# Patient Record
Sex: Female | Born: 1955 | Race: White | Hispanic: No | Smoking: Never smoker
Health system: Southern US, Community
[De-identification: ages and names within clinical notes are randomized; demographics above are authoritative.]

## PROBLEM LIST (undated history)

## (undated) DIAGNOSIS — E785 Hyperlipidemia, unspecified: Secondary | ICD-10-CM

## (undated) DIAGNOSIS — E119 Type 2 diabetes mellitus without complications: Secondary | ICD-10-CM

## (undated) HISTORY — DX: Hyperlipidemia, unspecified: E78.5

## (undated) HISTORY — PX: SPINE SURGERY: SHX786

## (undated) HISTORY — PX: COLONOSCOPY: SHX174

## (undated) HISTORY — PX: HAND SURGERY: SHX662

## (undated) HISTORY — DX: Type 2 diabetes mellitus without complications: E11.9

---

## 1998-12-28 ENCOUNTER — Other Ambulatory Visit: Admission: RE | Admit: 1998-12-28 | Discharge: 1998-12-28 | Payer: Self-pay | Admitting: Obstetrics and Gynecology

## 1999-06-03 ENCOUNTER — Encounter: Payer: Self-pay | Admitting: Obstetrics and Gynecology

## 1999-06-03 ENCOUNTER — Encounter: Admission: RE | Admit: 1999-06-03 | Discharge: 1999-06-03 | Payer: Self-pay | Admitting: Obstetrics and Gynecology

## 2000-02-01 ENCOUNTER — Other Ambulatory Visit: Admission: RE | Admit: 2000-02-01 | Discharge: 2000-02-01 | Payer: Self-pay | Admitting: Obstetrics and Gynecology

## 2000-02-02 ENCOUNTER — Encounter (INDEPENDENT_AMBULATORY_CARE_PROVIDER_SITE_OTHER): Payer: Self-pay

## 2000-02-02 ENCOUNTER — Other Ambulatory Visit: Admission: RE | Admit: 2000-02-02 | Discharge: 2000-02-02 | Payer: Self-pay | Admitting: Obstetrics and Gynecology

## 2001-02-18 ENCOUNTER — Other Ambulatory Visit: Admission: RE | Admit: 2001-02-18 | Discharge: 2001-02-18 | Payer: Self-pay | Admitting: Obstetrics and Gynecology

## 2002-05-06 ENCOUNTER — Other Ambulatory Visit: Admission: RE | Admit: 2002-05-06 | Discharge: 2002-05-06 | Payer: Self-pay | Admitting: Obstetrics and Gynecology

## 2002-07-31 ENCOUNTER — Ambulatory Visit (HOSPITAL_COMMUNITY): Admission: RE | Admit: 2002-07-31 | Discharge: 2002-07-31 | Payer: Self-pay | Admitting: Obstetrics and Gynecology

## 2002-07-31 ENCOUNTER — Encounter (INDEPENDENT_AMBULATORY_CARE_PROVIDER_SITE_OTHER): Payer: Self-pay | Admitting: *Deleted

## 2003-06-18 ENCOUNTER — Other Ambulatory Visit: Admission: RE | Admit: 2003-06-18 | Discharge: 2003-06-18 | Payer: Self-pay | Admitting: Obstetrics and Gynecology

## 2003-09-08 ENCOUNTER — Encounter (INDEPENDENT_AMBULATORY_CARE_PROVIDER_SITE_OTHER): Payer: Self-pay | Admitting: *Deleted

## 2003-09-09 ENCOUNTER — Inpatient Hospital Stay (HOSPITAL_COMMUNITY): Admission: RE | Admit: 2003-09-09 | Discharge: 2003-09-11 | Payer: Self-pay | Admitting: Obstetrics and Gynecology

## 2004-12-06 ENCOUNTER — Other Ambulatory Visit: Admission: RE | Admit: 2004-12-06 | Discharge: 2004-12-06 | Payer: Self-pay | Admitting: Obstetrics and Gynecology

## 2007-03-28 ENCOUNTER — Ambulatory Visit: Payer: Self-pay | Admitting: Internal Medicine

## 2007-04-11 ENCOUNTER — Ambulatory Visit: Payer: Self-pay | Admitting: Internal Medicine

## 2007-07-11 ENCOUNTER — Ambulatory Visit (HOSPITAL_COMMUNITY): Admission: RE | Admit: 2007-07-11 | Discharge: 2007-07-12 | Payer: Self-pay | Admitting: Orthopedic Surgery

## 2010-06-28 NOTE — Op Note (Signed)
Megan Stephenson, PENICK                  ACCOUNT NO.:  [id number]   MEDICAL RECORD NO.:  [id number]          PATIENT TYPE:  AMB   LOCATION:  DAY                          FACILITY:  [facility]   PHYSICIAN:  Georges Lynch. Gioffre, M.D.DATE OF BIRTH:  04/07/1955   DATE OF PROCEDURE:  07/11/2007  DATE OF DISCHARGE:                               OPERATIVE REPORT   SURGEON:  Dr. Darrelyn Hillock.   ASSISTANT:  Dr. Marlowe Kays.   PREOPERATIVE DIAGNOSES:  Large herniated lumbar disk with migration  proximally with a partial footdrop on the left.   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   OPERATION:  1. Decompression of lateral recess at L5-S1 on the left.  2. Microdiskectomy L5-S1 on the left.   DESCRIPTION OF PROCEDURE:  Under general anesthesia with the patient on  a spinal frame, routine orthopedic prep and draping of the lower back  was carried out.  The patient was given 1 gm of IV Ancef.  Two needles  were placed in the back for localization purposes.  I then made an  incision over the L5-S1 interspace.  Bleeders identified and cauterized  and the self-retaining retractors were inserted after we stripped the  muscle from the spinous process and the lamina.  Another x-ray was taken  with instruments in place, and we localized the L5-S1 space.  We then  carried out a hemilaminectomy.  We had to go far proximally and way out  lateral in the lateral recess to decompress the recess before we got to  the large disk fragment.  This fragment migrated proximally and was  exerting pressure on the 5 root above.  The disk was herniated  subligamentous.  The microscope was brought.  We removed the ligamentum  flavum in the usual fashion.  With the microscope, we went down and  gently retracted the dura with the D'Errico retractor.  We cauterized  lateral recess veins after we decompressed the recess.  We then went in  and made a  cruciate incision in the posterior longitudinal ligament.  We  then inserted a nerve hook and literally brought out a huge large  fragment of disk that was migrated proximally.  Once that was done, we  passed the Epstein curette up under the posterior longitudinal ligament  and there was no other fragments proximally.  We then went distally and  removed another large piece of disk distally.  Once that was done, the  nerve root now was free.  We did a nice foraminotomy distally as well.  We were able to easily pass the hockey-stick distally and proximally and  the dura now was free.  The foramen was free from the 5 root and the S1  root.  We thoroughly irrigated out the area.  There was no other  abnormalities noted.  I inserted 10 mL of FloSeal along with some  thrombin-soaked Gelfoam loosely.  I then closed the wound in layers in the usual fashion.  I left a small  deep portion of the wound  open proximally and distally for drainage  purposes.  The subcu was closed with 0-Vicryl, skin with metal staples  and a sterile Neosporin dressing was applied.  The patient left the  operating room in satisfactory condition.           ______________________________  Georges Lynch Darrelyn Hillock, M.D.     RAG/MEDQ  D:  07/11/2007  T:  07/11/2007  Job:  [id number]

## 2010-07-01 NOTE — H&P (Signed)
NAMELin, Megan Stephenson NO.:  [id number]   MEDICAL RECORD NO.:  [id number]                   PATIENT TYPE:   LOCATION:                                       FACILITY:   PHYSICIAN:  Duke Salvia. Marcelle Overlie, M.D.            DATE OF BIRTH:   DATE OF ADMISSION:  09/08/2003  DATE OF DISCHARGE:                                HISTORY & PHYSICAL   CHIEF COMPLAINT:  Menorrhagia.   HISTORY OF PRESENT ILLNESS:  A 54 year old, G4, P2, prior tubal.  This  patient has had 2 prior cesarean sections.  Due to problems with continued  bleeding, she underwent D&C and cryoablation June 2004.  Pathology then was  unremarkable.  She, unfortunately, has continued to have problems with heavy  bleeding, and presents now for LSH/BSO.  She has never had abnormal  cytology.  After discussing options for hysterectomy, has a preference for  [facility].  This procedure, including risks of bleeding, infection, transfusion,  adjacent organ injury, the possible need for open or additional surgery were  all discussed, along with the need for continued follow up cytology.   PAST MEDICAL HISTORY:   ALLERGIES:  PENICILLIN.   OPERATIONS:  1. A D&C.  2. Cryoablation.  3. Cesarean section x2.   FAMILY HISTORY:  Unremarkable.   PHYSICAL EXAMINATION:  VITAL SIGNS:  Temperature 98.2, blood pressure  114/70.  HEENT:  Unremarkable.  NECK:  Supple without masses.  LUNGS:  Clear.  CARDIOVASCULAR:  Regular rate and rhythm without murmurs, rubs, or gallops.  BREASTS:  Without masses.  ABDOMEN:  Soft, flat, nontender.  PELVIC:  Normal external genital.  __________ clear.  Uterus mid-position,  normal size.  Adnexa negative.  EXTREMITIES:  Unremarkable.  NEUROLOGIC:  Unremarkable.   IMPRESSION:  Continued menorrhagia with possible adenomyosis.   PLAN:  LSH/BSO.   Procedure and risks reviewed as above.                                               Richard M. Marcelle Overlie, M.D.    RMH/MEDQ  D:   09/01/2003  T:  09/01/2003  Job:  [id number]

## 2010-07-01 NOTE — Discharge Summary (Signed)
NAME:  Megan Stephenson, Megan Stephenson                            ACCOUNT NO.:  [id number]   MEDICAL RECORD NO.:  [id number]                   PATIENT TYPE:  INP   LOCATION:  9308                                 FACILITY:  [facility]   PHYSICIAN:  Duke Salvia. Marcelle Overlie, M.D.            DATE OF BIRTH:  11/28/1955   DATE OF ADMISSION:  09/08/2003  DATE OF DISCHARGE:                                 DISCHARGE SUMMARY   DISCHARGE DIAGNOSES:  1. Pelvic pain and menorrhagia.  2. Laparoscopy followed by TAH/BSO this admission.   SUMMARY OF THE HISTORY AND PHYSICAL EXAMINATION:  Please see admission H&P  for details.  Briefly, a 55 year old with menorrhagia and chronic pelvic  pain presents for hysterectomy.   HOSPITAL COURSE:  On September 08, 2003 under general anesthesia the patient  underwent laparoscopy.  Due to dense anterior uterine and bladder flap  adhesions, decision made to proceed with open TAH/BSO which was carried out  without difficulty.  On postoperative day #1 hemoglobin was 11.8, her diet  was slowly advanced.  Postoperative day #2 she was still complaining of some  significant soreness although she was afebrile and tolerating a regular diet  at that point.  We waited for discharge to postoperative day #3.  The  morning of discharge she was afebrile, ambulating without difficulty, still  complaining of soreness but this was managed with her oral pain medicine.  She was afebrile.  The incision was clean and dry except for some bruising  and she was ready for discharge at that point.   LABORATORY DATA:  Preoperative hemoglobin 15.4.  Blood type is O positive.  UA was negative.  Postoperative hemoglobin on July 27 was 11.8.  Pathology  report is still pending.   DISPOSITION:  The patient discharged on Tylox p.r.n. pain.  Will return to  the office in 3 days to have the clips removed.  Advised to report any  incisional redness or drainage, increased pain or bleeding, or fever over  101.  She was given  specific instructions regarding diet, sex, exercise.   CONDITION:  Good.   ACTIVITY:  Graded increase.                                               Richard M. Marcelle Overlie, M.D.    RMH/MEDQ  D:  09/11/2003  T:  09/11/2003  Job:  [id number]

## 2010-07-01 NOTE — Op Note (Signed)
   NAME:  Megan Stephenson, Megan Stephenson                            ACCOUNT NO.:  [id number]   MEDICAL RECORD NO.:  [id number]                   PATIENT TYPE:  AMB   LOCATION:  SDC                                  FACILITY:  [facility]   PHYSICIAN:  Duke Salvia. Marcelle Overlie, M.D.            DATE OF BIRTH:  1955-12-16   DATE OF PROCEDURE:  07/31/2002  DATE OF DISCHARGE:                                 OPERATIVE REPORT   PREOPERATIVE DIAGNOSIS:  Menorrhagia.   POSTOPERATIVE DIAGNOSIS:  Menorrhagia.   PROCEDURE:  Dilatation and curettage, cryoablation.   SURGEON:  Duke Salvia. Marcelle Overlie, M.D.   ANESTHESIA:  General endotracheal.   COMPLICATIONS:  None.   DRAINS:  Foley catheter to fill the bladder.   PROCEDURE AND FINDINGS:  The patient taken to the operating room.  After an  adequate level of general endotracheal anesthesia was obtained with the  patient's legs in stirrups, the perineum and vagina prepped and draped in  the usual manner for a vaginal procedure.  The uterus was examined and noted  to be midposition, normal size, adnexa negative.  The catheter was  positioned, the bladder was filled in preparation for ultrasound.  The  cervix was grasped with a tenaculum and was sounded to 9-10 cm.  It was  progressively dilated to a 30 Pratt dilator.  A small curette was then used  to perform a D&C.  There was minimal tissue noted.  This was submitted to  pathology.  The uterus was re-sounded, confirmed depth of 9-10 cm.  Under  ultrasound guidance the uterus was imaged in a longitudinal plane and the  cryoprobe was inserted to the cornu at the appropriate depth and was noted  on ultrasound.  A five-minute cryo sequence was then performed, observing  ice ball formation on ultrasound.  This was thawed and removed.  We waited  five minutes and then passed the cryoprobe into the opposite cornu at the  appropriate depth, again verified by ultrasound, five-minute second cryo  sequence performed without difficulty.  She  tolerated this well and went to  the recovery room in good condition.                                               Richard M. Marcelle Overlie, M.D.    RMH/MEDQ  D:  07/31/2002  T:  07/31/2002  Job:  [id number]

## 2010-07-01 NOTE — H&P (Signed)
   NAMERichelle, Glick NO.:  [id number]   MEDICAL RECORD NO.:  [id number]                   PATIENT TYPE:   LOCATION:                                       FACILITY:   PHYSICIAN:  Duke Salvia. Marcelle Overlie, M.D.            DATE OF BIRTH:   DATE OF ADMISSION:  07/31/2002  DATE OF DISCHARGE:                                HISTORY & PHYSICAL   CHIEF COMPLAINT:  Menorrhagia.   HISTORY AND PHYSICAL:  The patient is a 55 year old Gravida IV, Para 2 with  a prior tubal who has been on Ortho-Evra for management of DUB without  significant improvement.  In our office she had FHD that showed no  intracavity masses or other abnormalities except for a possible thickened  myometrium suggestive of adenomyosis.  She presents now for cryoablation.  This procedure including the risks of bleeding, infection, other  complications such as perforation that may require additional surgery were  all discussed with her.  The 30% chance for amenorrhea and the 80 to 90%  hypomenorrhea rate were discussed with her, all of which she understands and  accepts.   ALLERGIES:  1. PENICILLIN.   CURRENT MEDICATIONS:  1. Pravachol.   REVIEW OF SYMPTOMS:  OB/GYN:  Significant for DUB.  She has had two prior  cesarean sections with a tubal ligation.  GENERAL:  She has a history of  being on Redux in the mid nineties for weight loss.   PHYSICAL EXAMINATION:  VITAL SIGNS:  Temperature 98.2, blood pressure  120/72.  HEENT:  Unremarkable. The neck is supple without masses.  LUNGS:  Clear.  CARDIOVASCULAR:  Regular rate and rhythm without murmurs, rubs or gallops.  BREASTS:  Without masses.  ABDOMEN:  Flat, soft and nontender.  PELVIC EXAM:  Normal external genitalia.  Vagina:  Introitus is clear.  Uterus anterior.  Adnexa are negative.   IMPRESSION:  1. Normal exam.  2. Menorrhagia unresponsive to conservative measures.   PLAN:  Cryoablation.  The procedure and the risks are reviewed  as above.                                               Richard M. Marcelle Overlie, M.D.    RMH/MEDQ  D:  07/29/2002  T:  07/29/2002  Job:  [id number]

## 2010-07-01 NOTE — Op Note (Signed)
NAME:  Megan Stephenson, Megan Stephenson                            ACCOUNT NO.:  [id number]   MEDICAL RECORD NO.:  [id number]                   PATIENT TYPE:  OBV   LOCATION:  9399                                 FACILITY:  [facility]   PHYSICIAN:  Duke Salvia. Marcelle Overlie, M.D.            DATE OF BIRTH:  1955-03-27   DATE OF PROCEDURE:  09/08/2003  DATE OF DISCHARGE:                                 OPERATIVE REPORT   PREOPERATIVE. DIAGNOSES:  Chronic pelvic pain and menorrhagia.   POSTOPERATIVE DIAGNOSES:  1. Chronic pelvic pain and menorrhagia.  2. 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   PROCEDURE:  Diagnostic laparoscopy followed by TAH/BSO, lysis of adhesions.   SURGEON:  Duke Salvia. Marcelle Overlie, M.D.   ASSISTANT:  Guy Sandifer. Henderson Cloud, M.D.   ANESTHESIA:  General endotracheal.   SPECIMENS REMOVED:  Uterus, bilateral tubes and ovaries.   ESTIMATED BLOOD LOSS:  200.   DESCRIPTION OF PROCEDURE:  The patient was taken to the operating room and  after an adequate level of general endotracheal anesthesia was administered  with the patient's legs in stirrups, the abdomen, perineum and vagina were  prepped and draped with Betadine and bladder was drained. EUA carried out,  uterus mid position, normal size, adnexa negative.  A sound was positioned  into the uterus.  The subumbilical area was infiltrated with 0.5% Marcaine  plain, a small incision was made, the Veress needle was introduced without  difficulty, its intraabdominal position was verified by pressure and water  testing.  After a 2.5 liter pneumoperitoneum was then created, laparoscopic  trocar and sleeve were then introduced without difficulty. With the patient  in Trendelenburg, dense severe adhesions between the bladder flap area,  uterus and anterior abdominal wall were noted. A decision made to proceed  with laparotomy, TAH and BSO.  The patient's legs were placed high, Foley  catheter was already in draining clear urine.  A  Pfannenstiel incision made  through the old scar which was carried down the fascia, the fascia incised  and extended transversely. The rectus muscle was divided in the midline,  peritoneum entered superiorly without incident and extended in a vertical  manner.  An O'Connor-O'Sullivan retractor was positioned, the bowel was  packed superiorly out of the field. Bilateral tubes and ovaries were normal.  The dense adhesions were lysed with sharp and blunt dissection staying close  to the uterus.  The bladder was advanced below with sharp and blunt  dissection below the cervicovaginal junction. The uteroovarian pedicles were  then clamped with Kelly clamps. Starting on the left, the round ligaments  were clamped, divided and suture ligated with #0 Vicryl suture. The  retroperitoneal space on that side was developed, the course of the ureter  was well below. The left IP ligament was isolated, clamped, first free tied,  followed by suture ligature of #0 Vicryl.  This was done after assuring that  the course of the ureter was well below, the exact same repeated on the  opposite side.  After this was completed, the uterine vasculature pedicles  on each side were skeletonized, clamped, divided and suture ligated with #0  Dexon.  After assuring that the bladder was well below the cardinal  ligament, the uterosacral ligament and cervicovaginal pedicles were clamped,  divided and suture ligated with #0 Dexon. The specimen was then removed, the  cuff was closed with a running locked 2-0 Monocryl suture. The pelvis was  then irrigated with saline, the operative site was noted to be hemostatic.  The cecum and appendix were unremarkable. The upper abdomen was otherwise  unremarkable. Prior to closure, sponge, needle and instrument counts were  reported as correct x2.  The peritoneum was closed with a running 2-0 Dexon  suture. The fascia closed from laterally to midline on either side with a #1  PDS suture.  The On-Q subfascial and subcutaneous catheters were positioned  through separate puncture sites above and the incision was closed with clips  and Steri-Strips. The On-Q pump was primed and secured. Two skin tags in the  left anterior abdominal wall were excised and closed with each a single 3-0  Vicryl Rapide suture. She tolerated this well and went to recovery room in  good condition.                                               Richard M. Marcelle Overlie, M.D.    RMH/MEDQ  D:  09/08/2003  T:  09/08/2003  Job:  [id number]

## 2010-11-09 LAB — CBC
MCHC: 33
MCV: 88.8
Platelets: 293
RBC: 4.98
RDW: 11.9

## 2010-11-09 LAB — COMPREHENSIVE METABOLIC PANEL
AST: 34
CO2: 27
Calcium: 9.7
Creatinine, Ser: 0.92
GFR calc Af Amer: >60
GFR calc non Af Amer: >60
Sodium: 141
Total Protein: 6.8

## 2010-11-09 LAB — DIFFERENTIAL
Eosinophils Relative: 2
Lymphocytes Relative: 46
Lymphs Abs: 4
Monocytes Relative: 8

## 2010-11-09 LAB — URINALYSIS, ROUTINE W REFLEX MICROSCOPIC
Bilirubin Urine: NEGATIVE
Hgb urine dipstick: NEGATIVE
Ketones, ur: NEGATIVE
Nitrite: NEGATIVE
Protein, ur: NEGATIVE
Urobilinogen, UA: 0.2

## 2010-11-09 LAB — PROTIME-INR: Prothrombin Time: 12.2

## 2012-08-29 ENCOUNTER — Encounter: Payer: BC Managed Care – PPO | Attending: Family Medicine | Admitting: Dietician

## 2012-08-29 ENCOUNTER — Encounter: Payer: Self-pay | Admitting: Dietician

## 2012-08-29 VITALS — Ht 63.0 in | Wt 218.1 lb

## 2012-08-29 DIAGNOSIS — Z713 Dietary counseling and surveillance: Secondary | ICD-10-CM | POA: Insufficient documentation

## 2012-08-29 DIAGNOSIS — E119 Type 2 diabetes mellitus without complications: Secondary | ICD-10-CM

## 2012-08-29 NOTE — Progress Notes (Signed)
  Patient was seen on 08/29/2012 for the first of a series of three diabetes self-management courses at the [facility]. The following learning objectives were met by the patient during this course:   Defines the role of glucose and insulin  Identifies type of diabetes and pathophysiology  Defines the diagnostic criteria for diabetes and prediabetes  States the risk factors for Type 2 Diabetes  States the symptoms of Type 2 Diabetes  Defines Type 2 Diabetes treatment goals  Defines Type 2 Diabetes treatment options  States the rationale for glucose monitoring  Identifies A1C, glucose targets, and testing times  Identifies proper sharps disposal  Defines the purpose of a diabetes food plan  Identifies carbohydrate food groups  Defines effects of carbohydrate foods on glucose levels  Identifies carbohydrate choices/grams/food labels  States benefits of physical activity and effect on glucose  Review of suggested activity guidelines  Handouts given during class include:  Type 2 Diabetes: Basics Book  My Food Plan Book  Food and Activity Log  Follow-Up Plan: Core 2 Class  

## 2013-11-21 ENCOUNTER — Encounter: Payer: Self-pay | Admitting: Internal Medicine

## 2014-09-14 ENCOUNTER — Other Ambulatory Visit: Payer: Self-pay | Admitting: Obstetrics and Gynecology

## 2014-09-17 LAB — CYTOLOGY - PAP

## 2020-02-20 ENCOUNTER — Encounter: Payer: Self-pay | Admitting: Gastroenterology

## 2020-02-26 DIAGNOSIS — E669 Obesity, unspecified: Secondary | ICD-10-CM | POA: Insufficient documentation

## 2020-02-26 DIAGNOSIS — E782 Mixed hyperlipidemia: Secondary | ICD-10-CM | POA: Insufficient documentation

## 2020-02-26 DIAGNOSIS — F419 Anxiety disorder, unspecified: Secondary | ICD-10-CM | POA: Insufficient documentation

## 2020-02-26 DIAGNOSIS — E1169 Type 2 diabetes mellitus with other specified complication: Secondary | ICD-10-CM | POA: Insufficient documentation

## 2020-02-27 ENCOUNTER — Ambulatory Visit (AMBULATORY_SURGERY_CENTER): Payer: Self-pay | Admitting: *Deleted

## 2020-02-27 ENCOUNTER — Other Ambulatory Visit: Payer: Self-pay

## 2020-02-27 VITALS — Ht 62.75 in | Wt 186.0 lb

## 2020-02-27 DIAGNOSIS — Z1211 Encounter for screening for malignant neoplasm of colon: Secondary | ICD-10-CM

## 2020-02-27 MED ORDER — PLENVU 140 G PO SOLR: 1.0000 | Freq: Once | ORAL | 0 refills | Status: AC

## 2020-02-27 NOTE — Progress Notes (Signed)

## 2020-03-10 ENCOUNTER — Encounter: Payer: Self-pay | Admitting: Gastroenterology

## 2020-03-15 ENCOUNTER — Ambulatory Visit (AMBULATORY_SURGERY_CENTER): Payer: BC Managed Care – PPO | Admitting: Gastroenterology

## 2020-03-15 ENCOUNTER — Encounter: Payer: Self-pay | Admitting: Gastroenterology

## 2020-03-15 ENCOUNTER — Other Ambulatory Visit: Payer: Self-pay

## 2020-03-15 VITALS — BP 133/84 | HR 85 | Temp 98.4°F | Resp 14 | Ht 62.75 in | Wt 186.0 lb

## 2020-03-15 DIAGNOSIS — D120 Benign neoplasm of cecum: Secondary | ICD-10-CM

## 2020-03-15 DIAGNOSIS — D122 Benign neoplasm of ascending colon: Secondary | ICD-10-CM

## 2020-03-15 DIAGNOSIS — K635 Polyp of colon: Secondary | ICD-10-CM

## 2020-03-15 DIAGNOSIS — Z1211 Encounter for screening for malignant neoplasm of colon: Secondary | ICD-10-CM

## 2020-03-15 MED ORDER — SODIUM CHLORIDE 0.9 % IV SOLN: 500.0000 mL | Freq: Once | INTRAVENOUS | Status: DC

## 2020-03-15 NOTE — Progress Notes (Signed)
JB - Check-in  CW - VS  Pt's states no medical or surgical changes since previsit or office visit. 

## 2020-03-15 NOTE — Patient Instructions (Signed)
Impression/Recommendations: ? ?Polyp and diverticulosis handouts given to patient. ? ?Resume previous diet. ?Continue present medications. ?Await pathology results. ? ?Repeat colonoscopy recommended for surveillance.  Date to be determined after pathology results reviewed. ? ?YOU HAD AN ENDOSCOPIC PROCEDURE TODAY AT THE [facility]:   Refer to the procedure report that was given to you for any specific questions about what was found during the examination.  If the procedure report does not answer your questions, please call your gastroenterologist to clarify.  If you requested that your care partner not be given the details of your procedure findings, then the procedure report has been included in a sealed envelope for you to review at your convenience later. ? ?YOU SHOULD EXPECT: Some feelings of bloating in the abdomen. Passage of more gas than usual.  Walking can help get rid of the air that was put into your GI tract during the procedure and reduce the bloating. If you had a lower endoscopy (such as a colonoscopy or flexible sigmoidoscopy) you may notice spotting of blood in your stool or on the toilet paper. If you underwent a bowel prep for your procedure, you may not have a normal bowel movement for a few days. ? ?Please Note:  You might notice some irritation and congestion in your nose or some drainage.  This is from the oxygen used during your procedure.  There is no need for concern and it should clear up in a day or so. ? ?SYMPTOMS TO REPORT IMMEDIATELY: ? ?Following lower endoscopy (colonoscopy or flexible sigmoidoscopy): ? Excessive amounts of blood in the stool ? Significant tenderness or worsening of abdominal pains ? Swelling of the abdomen that is new, acute ? Fever of 100?F or higher ? ?For urgent or emergent issues, a gastroenterologist can be reached at any hour by calling [phone]. ?Do not use MyChart messaging for urgent concerns.  ? ? ?DIET:  We do recommend a small meal at  first, but then you may proceed to your regular diet.  Drink plenty of fluids but you should avoid alcoholic beverages for 24 hours. ? ?ACTIVITY:  You should plan to take it easy for the rest of today and you should NOT DRIVE or use heavy machinery until tomorrow (because of the sedation medicines used during the test).   ? ?FOLLOW UP: ?Our staff will call the number listed on your records 48-72 hours following your procedure to check on you and address any questions or concerns that you may have regarding the information given to you following your procedure. If we do not reach you, we will leave a message.  We will attempt to reach you two times.  During this call, we will ask if you have developed any symptoms of COVID 19. If you develop any symptoms (ie: fever, flu-like symptoms, shortness of breath, cough etc.) before then, please call [phone].  If you test positive for Covid 19 in the 2 weeks post procedure, please call and report this information to us.   ? ?If any biopsies were taken you will be contacted by phone or by letter within the next 1-3 weeks.  Please call us at [phone] if you have not heard about the biopsies in 3 weeks.  ? ? ?SIGNATURES/CONFIDENTIALITY: ?You and/or your care partner have signed paperwork which will be entered into your electronic medical record.  These signatures attest to the fact that that the information above on your After Visit Summary has been reviewed and is understood.    Full responsibility of the confidentiality of this discharge information lies with you and/or your care-partner.

## 2020-03-15 NOTE — Progress Notes (Signed)
PT taken to PACU. Monitors in place. VSS. Report given to RN. 

## 2020-03-15 NOTE — Progress Notes (Signed)
Called to room to assist during endoscopic procedure.  Patient ID and intended procedure confirmed with present staff. Received instructions for my participation in the procedure from the performing physician.  

## 2020-03-15 NOTE — Op Note (Signed)
[place] Patient Name: Megan Stephenson Procedure Date: 03/15/2020 7:18 AM MRN: [id number] Endoscopist: Mallie Mussel L. Loletha Carrow , MD Age: 65 Referring MD:  Date of Birth: 1955/09/25 Gender: Female Account #: [id number] Procedure:                Colonoscopy Indications:              Screening for colorectal malignant neoplasm Medicines:                Monitored Anesthesia Care Procedure:                Pre-Anesthesia Assessment:                           - Prior to the procedure, a History and Physical                            was performed, and patient medications and                            allergies were reviewed. The patient's tolerance of                            previous anesthesia was also reviewed. The risks                            and benefits of the procedure and the sedation                            options and risks were discussed with the patient.                            All questions were answered, and informed consent                            was obtained. Prior Anticoagulants: The patient has                            taken no previous anticoagulant or antiplatelet                            agents. ASA Grade Assessment: II - A patient with                            mild systemic disease. After reviewing the risks                            and benefits, the patient was deemed in                            satisfactory condition to undergo the procedure.                           After obtaining informed consent, the colonoscope  was passed under direct vision. Throughout the                            procedure, the patient's blood pressure, pulse, and                            oxygen saturations were monitored continuously. The                            Olympus CF-[id number]) Colonoscope was                            introduced through the anus and advanced to the the                            cecum, identified by  appendiceal orifice and                            ileocecal valve. The colonoscopy was somewhat                            difficult due to a tortuous colon. The patient                            tolerated the procedure well. The quality of the                            bowel preparation was good. The ileocecal valve,                            appendiceal orifice, and rectum were photographed.                            The bowel preparation used was Plenvu. Scope In: 7:51:02 AM Scope Out: 8:36:40 AM Scope Withdrawal Time: 0 hours 24 minutes 59 seconds  Total Procedure Duration: 0 hours 29 minutes 56 seconds  Findings:                 The perianal and digital rectal examinations were                            normal.                           A single small-mouthed diverticulum was found in                            the right colon.                           A 5 mm polyp was found in the cecum. The polyp was                            flat with a mucus cap. The polyp was removed with a  piecemeal technique using a cold biopsy forceps.                            Resection and retrieval were complete.                           Two flat polyps with mucua cap were found in the                            cecum. The polyps were 8 to 10 mm in size. These                            polyps were removed with a cold snare. Resection                            and retrieval were complete.                           A 8 mm polyp with mucua cap was found in the                            proximal ascending colon. The polyp was flat. The                            polyp was removed with a cold snare. Resection and                            retrieval were complete.                           The exam was otherwise without abnormality on                            direct and retroflexion views. Complications:            No immediate complications. Estimated Blood Loss:      Estimated blood loss was minimal. Impression:               - Diverticulosis in the right colon.                           - One 5 mm polyp in the cecum, removed piecemeal                            using a cold biopsy forceps. Resected and retrieved.                           - Two 8 to 10 mm polyps in the cecum, removed with                            a cold snare. Resected and retrieved.                           - One  8 mm polyp in the proximal ascending colon,                            removed with a cold snare. Resected and retrieved.                           - The examination was otherwise normal on direct                            and retroflexion views. Recommendation:           - Patient has a contact number available for                            emergencies. The signs and symptoms of potential                            delayed complications were discussed with the                            patient. Return to normal activities tomorrow.                            Written discharge instructions were provided to the                            patient.                           - Resume previous diet.                           - Continue present medications.                           - Await pathology results.                           - Repeat colonoscopy is recommended for                            surveillance. The colonoscopy date will be                            determined after pathology results from today's                            exam become available for review. Devanee Pomplun L. Loletha Carrow, MD 03/15/2020 8:43:06 AM This report has been signed electronically.

## 2020-03-17 ENCOUNTER — Telehealth: Payer: Self-pay

## 2020-03-17 NOTE — Telephone Encounter (Signed)
  Follow up Call-  Call back number 03/15/2020  Post procedure Call Back phone  # [phone] cell  Permission to leave phone message Yes  Some recent data might be hidden     Patient questions:  Do you have a fever, pain , or abdominal swelling? No. Pain Score  0 *  Have you tolerated food without any problems? Yes.    Have you been able to return to your normal activities? Yes.    Do you have any questions about your discharge instructions: Diet   No. Medications  No. Follow up visit  No.  Do you have questions or concerns about your Care? No.  Actions: * If pain score is 4 or above: No action needed, pain <4.  Have you developed a fever since your procedure? No 2.   Have you had an respiratory symptoms (SOB or cough) since your procedure? No 3.   Have you tested positive for COVID 19 since your procedure No  4.   Have you had any family members/close contacts diagnosed with the COVID 19 since your procedure?  No   If yes to any of these questions please route to Joylene John, RN and Joella Prince, RN

## 2020-03-19 ENCOUNTER — Encounter: Payer: Self-pay | Admitting: Gastroenterology

## 2020-08-18 ENCOUNTER — Ambulatory Visit
Admission: RE | Admit: 2020-08-18 | Discharge: 2020-08-18 | Disposition: A | Discharge status: Home / Self Care | Payer: BC Managed Care – PPO | Source: Ambulatory Visit | Attending: Family Medicine | Admitting: Family Medicine

## 2020-08-18 ENCOUNTER — Other Ambulatory Visit: Payer: Self-pay | Admitting: Family Medicine

## 2020-08-18 DIAGNOSIS — R1310 Dysphagia, unspecified: Secondary | ICD-10-CM

## 2020-08-18 DIAGNOSIS — R109 Unspecified abdominal pain: Secondary | ICD-10-CM

## 2020-08-20 ENCOUNTER — Other Ambulatory Visit: Payer: Self-pay | Admitting: Family Medicine

## 2020-08-20 ENCOUNTER — Ambulatory Visit
Admission: RE | Admit: 2020-08-20 | Discharge: 2020-08-20 | Disposition: A | Discharge status: Home / Self Care | Payer: BC Managed Care – PPO | Source: Ambulatory Visit | Attending: Family Medicine | Admitting: Family Medicine

## 2020-08-20 ENCOUNTER — Other Ambulatory Visit: Payer: Self-pay

## 2020-08-20 DIAGNOSIS — R109 Unspecified abdominal pain: Secondary | ICD-10-CM

## 2021-12-12 IMAGING — CT CT ABD-PELV W/O CM
2 of 4 series · 15 of 46 positions shown, 17 images · non-contrast
Comparison: None.

CLINICAL DATA: Three-week history flank pain.

EXAM:
CT ABDOMEN AND PELVIS WITHOUT CONTRAST
TECHNIQUE: Multidetector CT imaging of the abdomen and pelvis was performed
following the standard protocol without IV contrast.

[Series 2: renal stone 5.00 br40 s3 axial · axial · 0.68mm/px · z∈[+1192,+1642]mm · 12 of 100 slices shown, 14 images]
[im 5/100  soft-tissue]
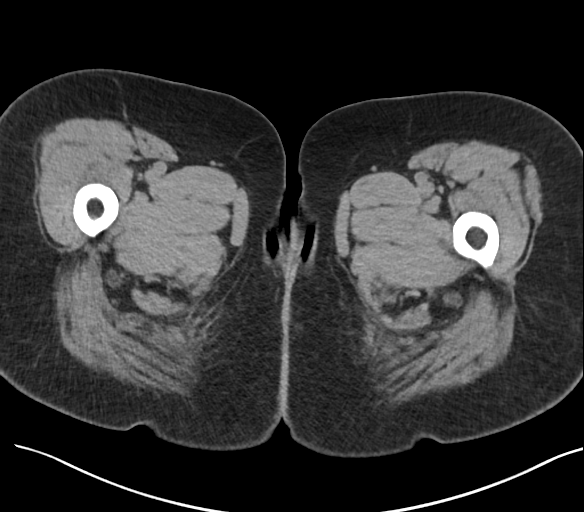
[im 5/100  bone]
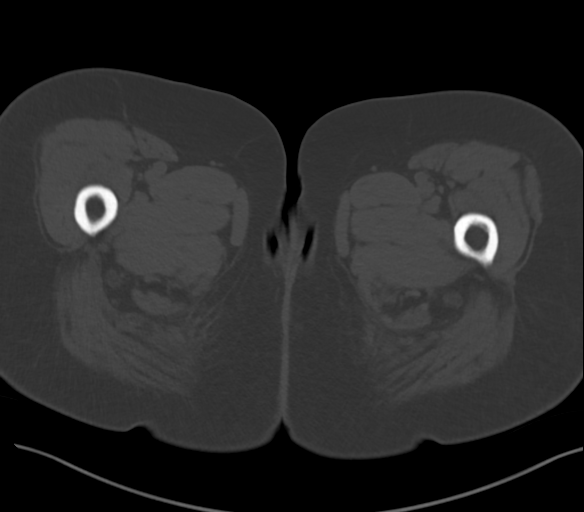
[im 13/100  soft-tissue]
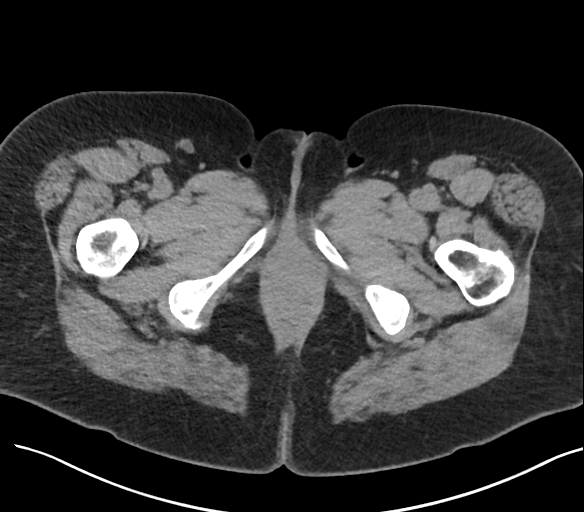
[im 21/100  soft-tissue]
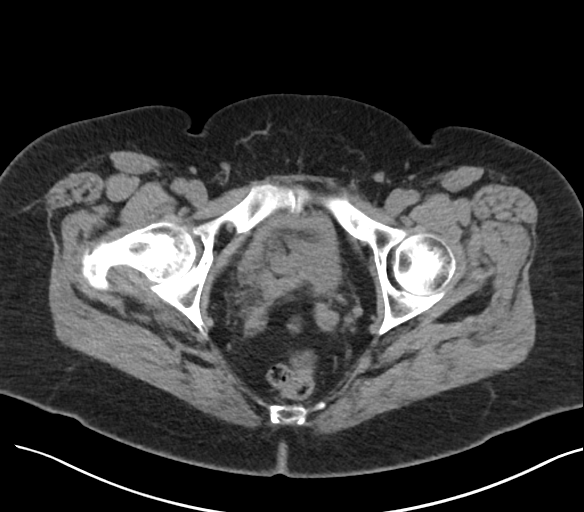
[im 29/100  soft-tissue]
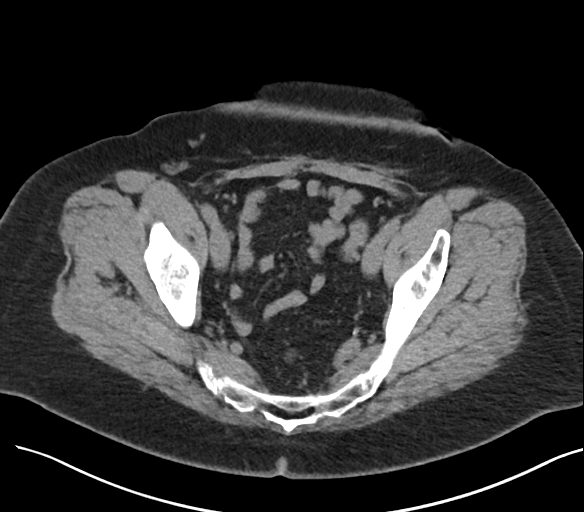
[im 38/100  soft-tissue]
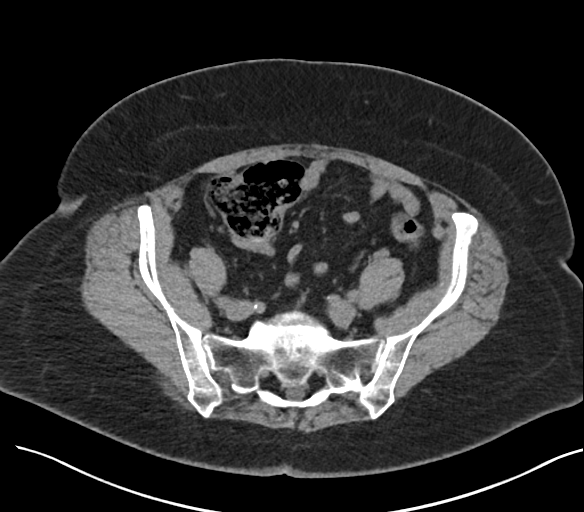
[im 46/100  soft-tissue]
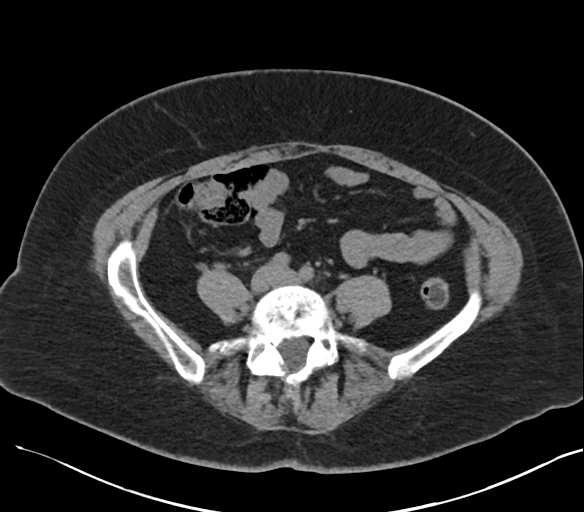
[im 54/100  soft-tissue]
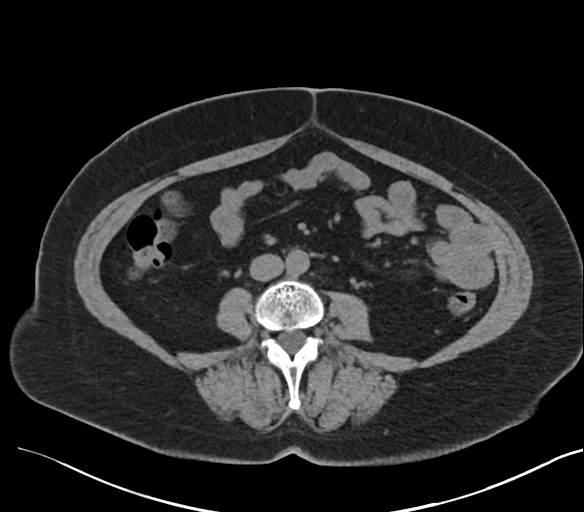
[im 62/100  soft-tissue]
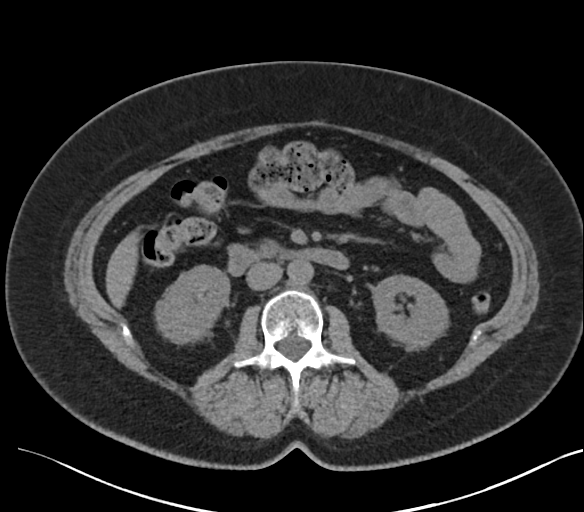
[im 71/100  soft-tissue]
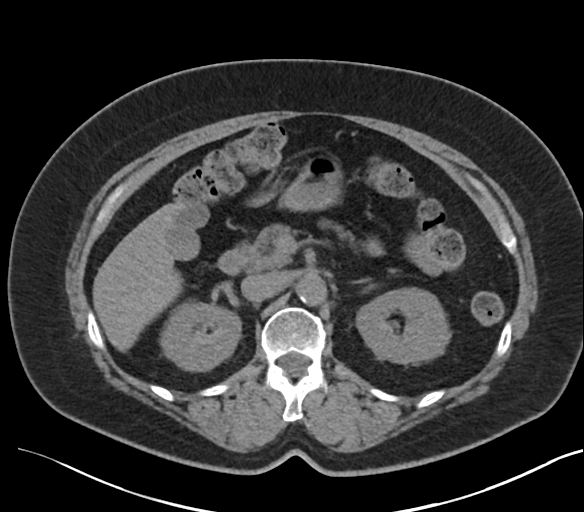
[im 71/100  bone]
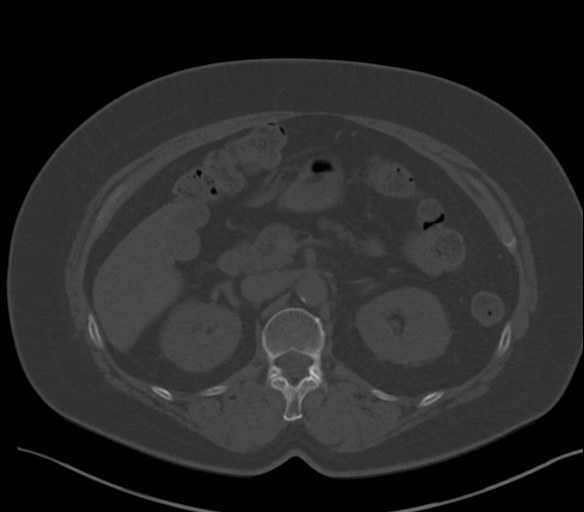
[im 79/100  soft-tissue]
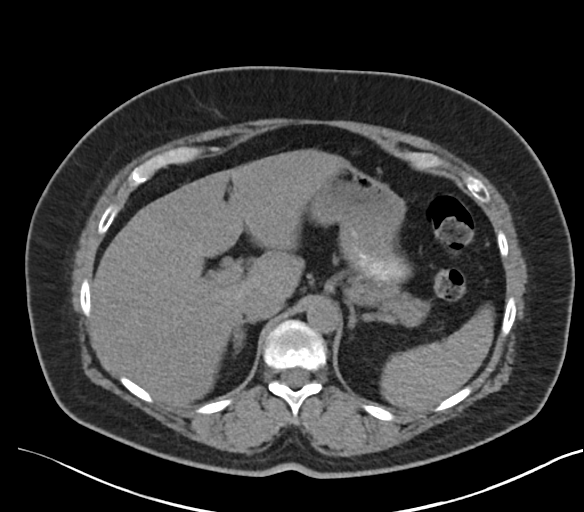
[im 87/100  soft-tissue]
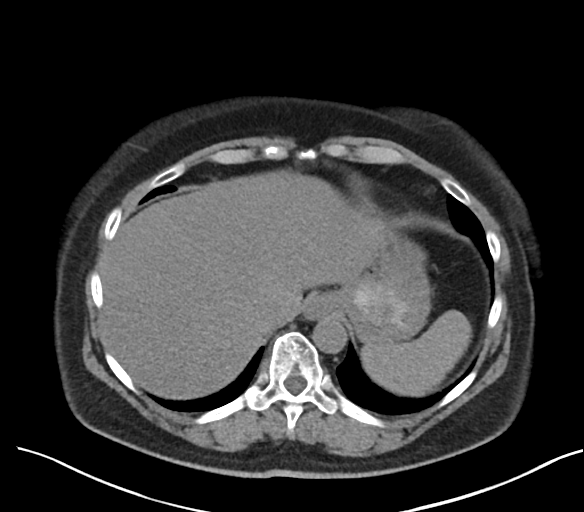
[im 95/100  soft-tissue]
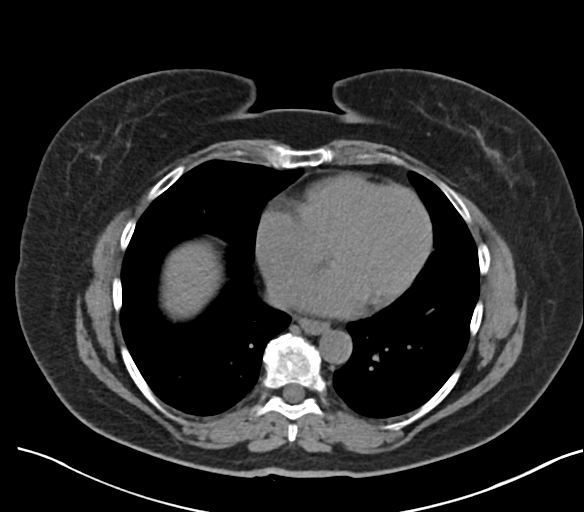

[Series 6: renal stone 2.00 br40 s3 cor · coronal · 0.78mm/px · 3 of 175 slices shown]
[im 59/175  soft-tissue]
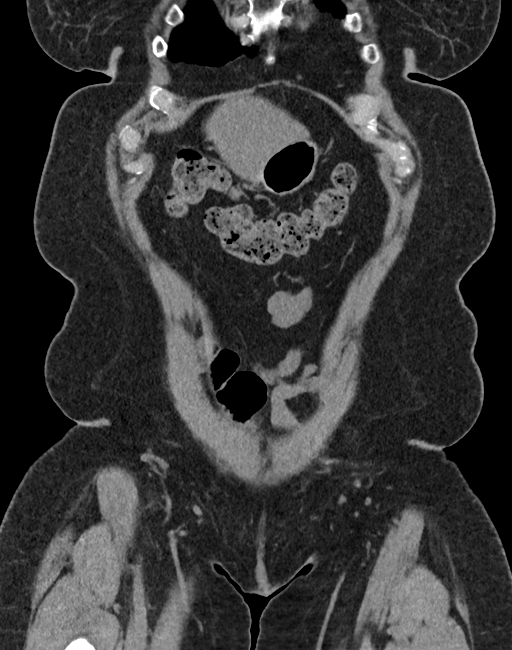
[im 78/175  soft-tissue]
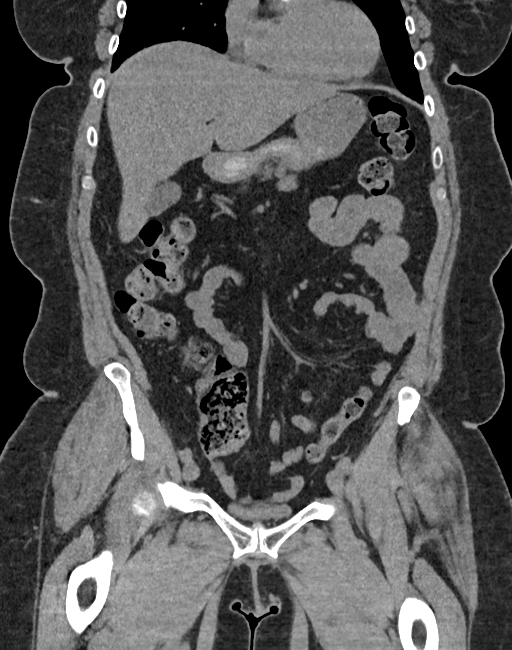
[im 97/175  soft-tissue]
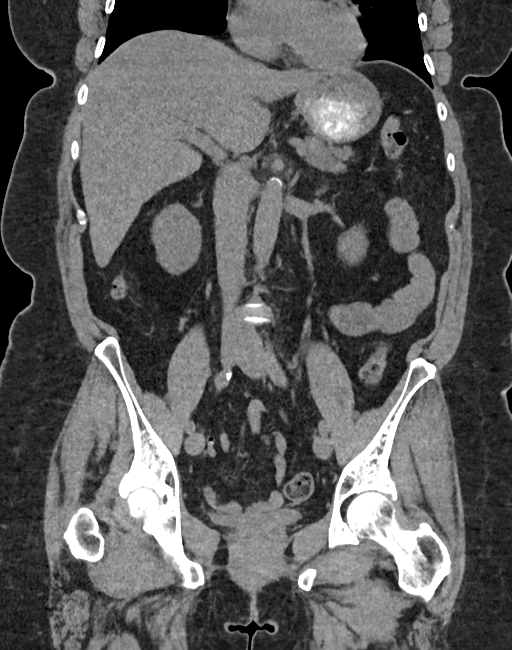

[15 of 46 positions shown; findings below may reference images not displayed]

FINDINGS: Lower chest: No acute abnormality.

Hepatobiliary: No focal liver abnormality is seen. No gallstones,
gallbladder wall thickening, or biliary dilatation.

Pancreas: Unremarkable. No pancreatic ductal dilatation or
surrounding inflammatory changes.

Spleen: Normal in size without focal abnormality.

Adrenals/Urinary Tract: Normal adrenal glands. No kidney stones
identified bilaterally. No mass or hydronephrosis. No hydroureter or
ureteral calculi. Bladder is unremarkable for degree of distension.

Stomach/Bowel: Stomach is normal. The appendix is visualized and is
within normal limits. No bowel wall thickening, inflammation or
distension.

Vascular/Lymphatic: Aortic atherosclerosis. No aneurysm. No
abdominopelvic adenopathy identified.

Reproductive: Status post hysterectomy. No adnexal masses.

Other: No free fluid or fluid collections.

Musculoskeletal: Degenerative disc disease noted at L3-4 through
L5-S1. No acute or suspicious osseous findings.
IMPRESSION: 1. No acute findings within the abdomen or pelvis. No explanation
for patient's flank pain.
2. Aortic atherosclerosis.

Aortic Atherosclerosis (J156M-O81.1).

## 2023-02-28 ENCOUNTER — Encounter: Payer: Self-pay | Admitting: Gastroenterology

## 2023-04-02 DIAGNOSIS — Z01419 Encounter for gynecological examination (general) (routine) without abnormal findings: Secondary | ICD-10-CM | POA: Diagnosis not present

## 2023-04-02 DIAGNOSIS — Z6829 Body mass index (BMI) 29.0-29.9, adult: Secondary | ICD-10-CM | POA: Diagnosis not present

## 2023-04-02 DIAGNOSIS — Z1231 Encounter for screening mammogram for malignant neoplasm of breast: Secondary | ICD-10-CM | POA: Diagnosis not present

## 2023-04-20 DIAGNOSIS — H1789 Other corneal scars and opacities: Secondary | ICD-10-CM | POA: Diagnosis not present

## 2023-04-20 DIAGNOSIS — H25813 Combined forms of age-related cataract, bilateral: Secondary | ICD-10-CM | POA: Diagnosis not present

## 2023-04-20 DIAGNOSIS — H353131 Nonexudative age-related macular degeneration, bilateral, early dry stage: Secondary | ICD-10-CM | POA: Diagnosis not present

## 2023-04-20 DIAGNOSIS — E113292 Type 2 diabetes mellitus with mild nonproliferative diabetic retinopathy without macular edema, left eye: Secondary | ICD-10-CM | POA: Diagnosis not present

## 2023-04-20 DIAGNOSIS — H524 Presbyopia: Secondary | ICD-10-CM | POA: Diagnosis not present

## 2023-05-01 ENCOUNTER — Encounter: Payer: Self-pay | Admitting: Gastroenterology

## 2023-05-24 DIAGNOSIS — H52213 Irregular astigmatism, bilateral: Secondary | ICD-10-CM | POA: Diagnosis not present

## 2023-05-24 DIAGNOSIS — H25813 Combined forms of age-related cataract, bilateral: Secondary | ICD-10-CM | POA: Diagnosis not present

## 2023-05-29 DIAGNOSIS — H25813 Combined forms of age-related cataract, bilateral: Secondary | ICD-10-CM | POA: Diagnosis not present

## 2023-05-30 ENCOUNTER — Ambulatory Visit (AMBULATORY_SURGERY_CENTER): Payer: Self-pay

## 2023-05-30 VITALS — Ht 63.0 in | Wt 160.0 lb

## 2023-05-30 DIAGNOSIS — Z860100 Personal history of colon polyps, unspecified: Secondary | ICD-10-CM

## 2023-05-30 MED ORDER — NA SULFATE-K SULFATE-MG SULF 17.5-3.13-1.6 GM/177ML PO SOLN: 1.0000 | Freq: Once | ORAL | 0 refills | Status: AC

## 2023-05-30 NOTE — Patient Instructions (Signed)
  ORAL DIABETIC MEDICATION INSTRUCTIONS Metformin Glipizide Jardiance Glimepiride Farixga Januvia  Rybelsus, Xigduo, Sitagliptin, Synjardy Tradjenta Actos, Alogliptin Invokana  The day before your procedure: 4/29  Take your diabetic pill as you do normally  The day of your procedure: 4/30  Do not take your diabetic pill   We will check your blood sugar levels during the admission process and again in Recovery before discharging you home  _____________________________________________________________________________   ONCE A WEEK INJECTIONS Ozempic,  Mounjaro, Wegovy, Trulicity, Tanzeum, Byetta, Victoza, Bydureon, & SymlinPen  -DO NOT TAKE 7 days prior to the procedure.  Last dose on or before Tuesday 4/22 failure to hold this medication will result in a cancellation or rescheduling of your procedure

## 2023-05-30 NOTE — Progress Notes (Signed)
 No egg or soy allergy known to patient  No issues known to pt with past sedation with any surgeries or procedures Patient denies ever being told they had issues or difficulty with intubation  No FH of Malignant Hyperthermia Pt is not on diet pills Pt is not on  home 02  Pt is not on blood thinners  Pt denies issues with constipation  No A fib or A flutter Have any cardiac testing pending-- no  LOA: independent  Prep: suprep   Patient's chart reviewed by Rogena Class CNRA prior to previsit and patient appropriate for the [facility].  Previsit completed and red dot placed by patient's name on their procedure day (on provider's schedule).     PV completed with patient. Prep instructions sent via mychart and home address.      ORAL DIABETIC MEDICATION INSTRUCTIONS Metformin Glipizide Jardiance Glimepiride Farixga Januvia  Rybelsus, Xigduo, Sitagliptin, Synjardy Tradjenta Actos, Alogliptin Invokana  The day before your procedure: 4/29  Take your diabetic pill as you do normally  The day of your procedure: 4/30  Do not take your diabetic pill   We will check your blood sugar levels during the admission process and again in Recovery before discharging you home  ____________________________________________________________________   ONCE A WEEK INJECTIONS Ozempic,  Mounjaro, Wegovy, Trulicity, Tanzeum, Byetta, Victoza, Bydureon, & SymlinPen  -DO NOT TAKE 7 days prior to the procedure.  Last dose on or before Tuesday 4/22 failure to hold this medication will result in a cancellation or rescheduling of your procedure

## 2023-06-07 ENCOUNTER — Encounter: Payer: Self-pay | Admitting: Gastroenterology

## 2023-06-10 ENCOUNTER — Encounter: Payer: Self-pay | Admitting: Certified Registered Nurse Anesthetist

## 2023-06-13 ENCOUNTER — Ambulatory Visit (AMBULATORY_SURGERY_CENTER): Payer: Self-pay | Admitting: Gastroenterology

## 2023-06-13 ENCOUNTER — Encounter: Payer: Self-pay | Admitting: Gastroenterology

## 2023-06-13 VITALS — BP 128/87 | HR 81 | Temp 98.1°F | Resp 18 | Ht 63.0 in | Wt 160.0 lb

## 2023-06-13 DIAGNOSIS — K635 Polyp of colon: Secondary | ICD-10-CM | POA: Diagnosis not present

## 2023-06-13 DIAGNOSIS — Z1211 Encounter for screening for malignant neoplasm of colon: Secondary | ICD-10-CM | POA: Diagnosis not present

## 2023-06-13 DIAGNOSIS — D123 Benign neoplasm of transverse colon: Secondary | ICD-10-CM

## 2023-06-13 DIAGNOSIS — D124 Benign neoplasm of descending colon: Secondary | ICD-10-CM

## 2023-06-13 DIAGNOSIS — K648 Other hemorrhoids: Secondary | ICD-10-CM

## 2023-06-13 DIAGNOSIS — D120 Benign neoplasm of cecum: Secondary | ICD-10-CM

## 2023-06-13 DIAGNOSIS — D125 Benign neoplasm of sigmoid colon: Secondary | ICD-10-CM

## 2023-06-13 DIAGNOSIS — Z860100 Personal history of colon polyps, unspecified: Secondary | ICD-10-CM

## 2023-06-13 MED ORDER — SODIUM CHLORIDE 0.9 % IV SOLN: 500.0000 mL | Freq: Once | INTRAVENOUS | Status: DC

## 2023-06-13 NOTE — Patient Instructions (Signed)

## 2023-06-13 NOTE — Progress Notes (Signed)
 History and Physical:  This patient presents for endoscopic testing for: Encounter Diagnosis  Name Primary?   Hx of colonic polyps Yes    Surveillance colonoscopy today for Hx polyps 4 SSp (one of them 10mm) last exam Jan 2022 Patient denies chronic abdominal pain, rectal bleeding, constipation or diarrhea.   Patient is otherwise without complaints or active issues today.   Past Medical History: Past Medical History:  Diagnosis Date   Diabetes mellitus without complication (HCC)    Hyperlipidemia      Past Surgical History: Past Surgical History:  Procedure Laterality Date   CESAREAN SECTION     COLONOSCOPY     HAND SURGERY     SPINE SURGERY      Allergies: Allergies  Allergen Reactions   Penicillins Rash    As a child    Outpatient Meds: Current Outpatient Medications  Medication Sig Dispense Refill   aspirin 81 MG chewable tablet Chew 81 mg by mouth daily.     Calcium Carbonate (CALCIUM 600 PO) Take 1 tablet by mouth daily.     Cholecalciferol (VITAMIN D-3) 125 MCG (5000 UT) TABS Take 1 tablet by mouth daily.     Cyanocobalamin (VITAMIN B-12) 5000 MCG TBDP Take 1 tablet by mouth daily.     Magnesium Bisglycinate (MAG GLYCINATE) 100 MG TABS      Multiple Vitamins-Minerals (CENTRUM SILVER 50+WOMEN PO) Take 1 tablet by mouth daily.     Multiple Vitamins-Minerals (ICAPS AREDS 2 PO) Take 2 tablets by mouth daily.     pravastatin (PRAVACHOL) 80 MG tablet Take 1 tablet by mouth daily.     ALPRAZolam (XANAX) 0.5 MG tablet Take 0.5 mg by mouth as needed.     atovaquone-proguanil (MALARONE) 250-100 MG TABS tablet Take 1 tablet by mouth daily.     JARDIANCE 25 MG TABS tablet Take 25 mg by mouth daily.     MOUNJARO 15 MG/0.5ML Pen Inject 15 mg into the skin once a week.     Current Facility-Administered Medications  Medication Dose Route Frequency Provider Last Rate Last Admin   0.9 %  sodium chloride  infusion  500 mL Intravenous Once Danis, Tarra Pence L III, MD           ___________________________________________________________________ Objective   Exam:  BP (!) 174/102   Pulse 72   Temp 98.1 F (36.7 C)   Resp 11   Ht 5\' 3"  (1.6 m)   Wt 160 lb (72.6 kg)   SpO2 100%   BMI 28.34 kg/m   CV: regular , S1/S2 Resp: clear to auscultation bilaterally, normal RR and effort noted GI: soft, no tenderness, with active bowel sounds.   Assessment: Encounter Diagnosis  Name Primary?   Hx of colonic polyps Yes     Plan: Colonoscopy   The benefits and risks of the planned procedure(s) were described in detail with the patient or (when appropriate) their health care proxy.  Risks were outlined as including, but not limited to, bleeding, infection, perforation, adverse medication reaction leading to cardiac or pulmonary decompensation, pancreatitis (if ERCP).  The limitation of incomplete mucosal visualization was also discussed.  No guarantees or warranties were given.  The patient is appropriate for an endoscopic procedure in the ambulatory setting.   - Lorella Roles, MD

## 2023-06-13 NOTE — Progress Notes (Signed)
 Report given to PACU, vss

## 2023-06-13 NOTE — Op Note (Signed)
 [facility] Patient Name: Megan Stephenson Procedure Date: 06/13/2023 9:34 AM MRN: [id number] Endoscopist: Ace Abu L. Dominic Friendly , MD, [id number] Age: 68 Referring MD:  Date of Birth: 01-21-1956 Gender: Female Account #: [id number] Procedure:                Colonoscopy Indications:              Personal history of sessile serrated colon polyp                            (10 mm or greater in size)                           SSP (up to 10mm) x 17 February 2020 Medicines:                Monitored Anesthesia Care Procedure:                Pre-Anesthesia Assessment:                           - Prior to the procedure, a History and Physical                            was performed, and patient medications and                            allergies were reviewed. The patient's tolerance of                            previous anesthesia was also reviewed. The risks                            and benefits of the procedure and the sedation                            options and risks were discussed with the patient.                            All questions were answered, and informed consent                            was obtained. Prior Anticoagulants: The patient has                            taken no anticoagulant or antiplatelet agents. ASA                            Grade Assessment: II - A patient with mild systemic                            disease. After reviewing the risks and benefits,                            the patient was deemed in satisfactory condition to  undergo the procedure.                           After obtaining informed consent, the colonoscope                            was passed under direct vision. Throughout the                            procedure, the patient's blood pressure, pulse, and                            oxygen saturations were monitored continuously. The                            CF [id number] #[id number] was introduced through the anus                             and advanced to the the cecum, identified by                            appendiceal orifice and ileocecal valve. The                            colonoscopy was somewhat difficult due to a                            redundant colon. Successful completion of the                            procedure was aided by using manual pressure and                            straightening and shortening the scope to obtain                            bowel loop reduction. The patient tolerated the                            procedure well. The quality of the bowel                            preparation was excellent. The ileocecal valve,                            appendiceal orifice, and rectum were photographed. Scope In: 9:54:05 AM Scope Out: 10:17:09 AM Scope Withdrawal Time: 0 hours 16 minutes 47 seconds  Total Procedure Duration: 0 hours 23 minutes 4 seconds  Findings:                 The perianal and digital rectal examinations were                            normal.  Repeat examination of right colon under NBI                            performed.                           A 12-14 mm polyp was found in the cecum. The polyp                            was semi-sessile. The polyp was removed with a cold                            snare. Resection and retrieval were complete. (Jar                            2)                           Two sessile polyps were found in the sigmoid colon                            and descending colon. The polyps were diminutive in                            size. These polyps were removed with a cold snare.                            Resection and retrieval were complete. (Jar 2)                           An 8 mm polyp was found in the transverse colon.                            The polyp was flat and mucous-capped. The polyp was                            removed with a cold snare. Resection and retrieval                             were complete. (Jar 1)                           Internal hemorrhoids were found.                           The exam was otherwise without abnormality on                            direct and retroflexion views. Complications:            No immediate complications. Estimated Blood Loss:     Estimated blood loss was minimal. Impression:               - One 12-14 mm polyp in the cecum, removed with a  cold snare. Resected and retrieved.                           - Two diminutive polyps in the sigmoid colon and in                            the descending colon, removed with a cold snare.                            Resected and retrieved.                           - One 8 mm polyp in the transverse colon, removed                            with a cold snare. Resected and retrieved.                           - Internal hemorrhoids.                           - The examination was otherwise normal on direct                            and retroflexion views. Recommendation:           - Patient has a contact number available for                            emergencies. The signs and symptoms of potential                            delayed complications were discussed with the                            patient. Return to normal activities tomorrow.                            Written discharge instructions were provided to the                            patient.                           - Resume previous diet.                           - Continue present medications.                           - Await pathology results.                           - Repeat colonoscopy is recommended for                            surveillance.  The colonoscopy date will be                            determined after pathology results from today's                            exam become available for review. Eevie Lapp L. Dominic Friendly, MD 06/13/2023 10:24:04 AM This report has been  signed electronically.

## 2023-06-13 NOTE — Progress Notes (Signed)
 Pt's states no medical or surgical changes since previsit or office visit.

## 2023-06-13 NOTE — Progress Notes (Signed)
 Called to room to assist during endoscopic procedure.  Patient ID and intended procedure confirmed with present staff. Received instructions for my participation in the procedure from the performing physician.

## 2023-06-14 ENCOUNTER — Telehealth: Payer: Self-pay

## 2023-06-14 NOTE — Telephone Encounter (Signed)
 Attempted f/u call. No answer, left VM.

## 2023-06-18 DIAGNOSIS — H25811 Combined forms of age-related cataract, right eye: Secondary | ICD-10-CM | POA: Diagnosis not present

## 2023-06-18 LAB — SURGICAL PATHOLOGY

## 2023-06-25 DIAGNOSIS — H25812 Combined forms of age-related cataract, left eye: Secondary | ICD-10-CM | POA: Diagnosis not present

## 2023-06-25 DIAGNOSIS — H52222 Regular astigmatism, left eye: Secondary | ICD-10-CM | POA: Diagnosis not present

## 2023-06-26 ENCOUNTER — Ambulatory Visit: Payer: Self-pay | Admitting: Gastroenterology

## 2023-09-25 DIAGNOSIS — E782 Mixed hyperlipidemia: Secondary | ICD-10-CM | POA: Diagnosis not present

## 2023-09-25 DIAGNOSIS — E1169 Type 2 diabetes mellitus with other specified complication: Secondary | ICD-10-CM | POA: Diagnosis not present

## 2023-09-25 DIAGNOSIS — F430 Acute stress reaction: Secondary | ICD-10-CM | POA: Diagnosis not present
